# Patient Record
Sex: Female | Born: 1970 | Race: Black or African American | Hispanic: No | Marital: Married | Smoking: Never smoker
Health system: Southern US, Community
[De-identification: ages and names within clinical notes are randomized; demographics above are authoritative.]

## PROBLEM LIST (undated history)

## (undated) DIAGNOSIS — F329 Major depressive disorder, single episode, unspecified: Secondary | ICD-10-CM

## (undated) DIAGNOSIS — I10 Essential (primary) hypertension: Secondary | ICD-10-CM

## (undated) DIAGNOSIS — F32A Depression, unspecified: Secondary | ICD-10-CM

## (undated) DIAGNOSIS — G4700 Insomnia, unspecified: Secondary | ICD-10-CM

## (undated) HISTORY — PX: TUBAL LIGATION: SHX77

---

## 2000-04-28 ENCOUNTER — Other Ambulatory Visit: Admission: RE | Admit: 2000-04-28 | Discharge: 2000-04-28 | Payer: Self-pay | Admitting: Obstetrics and Gynecology

## 2009-05-08 ENCOUNTER — Emergency Department (HOSPITAL_BASED_OUTPATIENT_CLINIC_OR_DEPARTMENT_OTHER): Admission: EM | Admit: 2009-05-08 | Discharge: 2009-05-08 | Payer: Self-pay | Admitting: Emergency Medicine

## 2010-05-05 ENCOUNTER — Emergency Department (HOSPITAL_BASED_OUTPATIENT_CLINIC_OR_DEPARTMENT_OTHER): Admission: EM | Admit: 2010-05-05 | Discharge: 2010-05-05 | Payer: Self-pay | Admitting: Emergency Medicine

## 2010-05-05 ENCOUNTER — Ambulatory Visit: Payer: Self-pay | Admitting: Diagnostic Radiology

## 2010-11-06 LAB — COMPREHENSIVE METABOLIC PANEL
BUN: 10 mg/dL (ref 6–23)
CO2: 29 mEq/L (ref 19–32)
Calcium: 9.6 mg/dL (ref 8.4–10.5)
Creatinine, Ser: 0.7 mg/dL (ref 0.4–1.2)
GFR calc non Af Amer: >60 mL/min (ref >60)
Glucose, Bld: 106 mg/dL — ABNORMAL HIGH (ref 70–99)
Total Protein: 7.9 g/dL (ref 6.0–8.3)

## 2010-11-06 LAB — DIFFERENTIAL
Eosinophils Absolute: 0.1 10*3/uL (ref 0.0–0.7)
Lymphs Abs: 2.9 10*3/uL (ref 0.7–4.0)
Monocytes Relative: 7 % (ref 3–12)
Neutro Abs: 4.9 10*3/uL (ref 1.7–7.7)
Neutrophils Relative %: 57 % (ref 43–77)

## 2010-11-06 LAB — URINALYSIS, ROUTINE W REFLEX MICROSCOPIC
Glucose, UA: NEGATIVE mg/dL
Ketones, ur: NEGATIVE mg/dL
Leukocytes, UA: NEGATIVE
Nitrite: NEGATIVE
Protein, ur: NEGATIVE mg/dL

## 2010-11-06 LAB — CBC
HCT: 33.9 % — ABNORMAL LOW (ref 36.0–46.0)
Hemoglobin: 11.7 g/dL — ABNORMAL LOW (ref 12.0–15.0)
MCH: 30.3 pg (ref 26.0–34.0)
MCHC: 34.5 g/dL (ref 30.0–36.0)
MCV: 87.9 fL (ref 78.0–100.0)
RDW: 12.3 % (ref 11.5–15.5)

## 2010-11-06 LAB — LIPASE, BLOOD: Lipase: 57 U/L (ref 23–300)

## 2010-11-06 LAB — URINE MICROSCOPIC-ADD ON

## 2010-11-06 LAB — PREGNANCY, URINE: Preg Test, Ur: NEGATIVE

## 2011-04-28 ENCOUNTER — Encounter: Payer: Self-pay | Admitting: *Deleted

## 2011-04-28 ENCOUNTER — Emergency Department (INDEPENDENT_AMBULATORY_CARE_PROVIDER_SITE_OTHER): Payer: Self-pay

## 2011-04-28 ENCOUNTER — Emergency Department (HOSPITAL_BASED_OUTPATIENT_CLINIC_OR_DEPARTMENT_OTHER)
Admission: EM | Admit: 2011-04-28 | Discharge: 2011-04-28 | Disposition: A | Discharge status: Home / Self Care | Payer: Self-pay | Attending: Emergency Medicine | Admitting: Emergency Medicine

## 2011-04-28 DIAGNOSIS — R209 Unspecified disturbances of skin sensation: Secondary | ICD-10-CM | POA: Insufficient documentation

## 2011-04-28 DIAGNOSIS — M545 Low back pain, unspecified: Secondary | ICD-10-CM

## 2011-04-28 DIAGNOSIS — M549 Dorsalgia, unspecified: Secondary | ICD-10-CM | POA: Insufficient documentation

## 2011-04-28 DIAGNOSIS — M25559 Pain in unspecified hip: Secondary | ICD-10-CM | POA: Insufficient documentation

## 2011-04-28 DIAGNOSIS — N390 Urinary tract infection, site not specified: Secondary | ICD-10-CM | POA: Insufficient documentation

## 2011-04-28 LAB — URINALYSIS, ROUTINE W REFLEX MICROSCOPIC
Glucose, UA: NEGATIVE mg/dL
Ketones, ur: NEGATIVE mg/dL
Leukocytes, UA: NEGATIVE
Nitrite: NEGATIVE
Protein, ur: NEGATIVE mg/dL
pH: 5.5 (ref 5.0–8.0)

## 2011-04-28 LAB — URINE MICROSCOPIC-ADD ON

## 2011-04-28 MED ORDER — HYDROCODONE-ACETAMINOPHEN 5-325 MG PO TABS: 2.0000 | ORAL_TABLET | ORAL | Status: AC | PRN

## 2011-04-28 MED ORDER — NITROFURANTOIN MONOHYD MACRO 100 MG PO CAPS: 100.0000 mg | ORAL_CAPSULE | Freq: Two times a day (BID) | ORAL | Status: AC

## 2011-04-28 MED ORDER — IBUPROFEN 800 MG PO TABS: 800.0000 mg | ORAL_TABLET | Freq: Three times a day (TID) | ORAL | Status: AC

## 2011-04-28 MED ORDER — HYDROCODONE-ACETAMINOPHEN 5-325 MG PO TABS
2.0000 | ORAL_TABLET | Freq: Once | ORAL | Status: AC
  Administered 2011-04-28: 2 via ORAL
  Filled 2011-04-28: qty 2

## 2011-04-28 NOTE — ED Provider Notes (Signed)
History     CSN: [id number] Arrival date & time: 04/28/2011 11:39 AM  Chief Complaint  Patient presents with  . Back Pain   Patient is a 40 y.o. female presenting with back pain. The history is provided by the patient. No language interpreter was used.  Back Pain  The current episode started yesterday. The problem occurs constantly. The problem has been gradually improving. The pain is associated with no known injury. The pain is present in the lumbar spine. The quality of the pain is described as aching. The pain is at a severity of 8/10. The pain is moderate. The symptoms are aggravated by bending. The pain is worse during the day. Stiffness is present in the morning. Associated symptoms include numbness, leg pain, paresthesias, paresis and tingling. Pertinent negatives include no dysuria and no pelvic pain.    History reviewed. No pertinent past medical history.  Past Surgical History  Procedure Date  . Tubal ligation     History reviewed. No pertinent family history.  History  Substance Use Topics  . Smoking status: Never Smoker   . Smokeless tobacco: Not on file  . Alcohol Use: No    OB History    Grav Para Term Preterm Abortions TAB SAB Ect Mult Living                  Review of Systems  Genitourinary: Negative for dysuria and pelvic pain.  Musculoskeletal: Positive for back pain.  Neurological: Positive for tingling, numbness and paresthesias.  All other systems reviewed and are negative.    Physical Exam  BP 139/80  Pulse 71  Temp(Src) 98.2 F (36.8 C) (Oral)  Resp 18  Ht 5\' 6"  (1.676 m)  Wt 161 lb (73.029 kg)  BMI 25.99 kg/m2  SpO2 100%  LMP 04/07/2011  Physical Exam  Nursing note and vitals reviewed. Constitutional: She is oriented to person, place, and time. She appears well-developed and well-nourished.  HENT:  Head: Normocephalic and atraumatic.  Eyes: Conjunctivae and EOM are normal. Pupils are equal, round, and reactive to light.  Neck:  Normal range of motion. Neck supple.  Cardiovascular: Normal rate and regular rhythm.   Pulmonary/Chest: Effort normal and breath sounds normal.  Abdominal: Soft.  Musculoskeletal: She exhibits tenderness.       Tender low back,  Pain with movement,  Pain with walking,  Neurological: She is alert and oriented to person, place, and time.  Skin: Skin is warm.    ED Course  Procedures  MDM Pt reports no injury,  Pt had some pain down right leg,  Pt complains of pain in her right hip.  Xray show ostetitis pubis,  I will treat uti, and pain.  Pt given Rx for macrobid and hydrocodone and ibuprofen.   Pt referred to Dr. Pearletha Forge for recheck. (Pt alos given [facility] info.      Langston Masker, [place] 04/28/11 1346

## 2011-04-28 NOTE — ED Notes (Signed)
Low back pain onset yesterday hurts worse when bending over or sitting a long time  States right leg feels tingly gets better after walking but starts after sitting

## 2011-04-28 NOTE — ED Provider Notes (Signed)
Medical screening examination/treatment/procedure(s) were performed by non-physician practitioner and as supervising physician I was immediately available for consultation/collaboration.   Glynn Octave, MD 04/28/11 [phone]

## 2011-06-17 ENCOUNTER — Other Ambulatory Visit (HOSPITAL_COMMUNITY): Payer: Self-pay | Admitting: Internal Medicine

## 2011-06-17 DIAGNOSIS — Z1231 Encounter for screening mammogram for malignant neoplasm of breast: Secondary | ICD-10-CM

## 2011-07-17 ENCOUNTER — Ambulatory Visit (HOSPITAL_COMMUNITY)
Admission: RE | Admit: 2011-07-17 | Discharge: 2011-07-17 | Disposition: A | Discharge status: Home / Self Care | Payer: Self-pay | Source: Ambulatory Visit | Attending: Internal Medicine | Admitting: Internal Medicine

## 2011-07-17 DIAGNOSIS — Z1231 Encounter for screening mammogram for malignant neoplasm of breast: Secondary | ICD-10-CM

## 2013-10-03 ENCOUNTER — Other Ambulatory Visit: Payer: Self-pay | Admitting: Obstetrics and Gynecology

## 2013-10-03 ENCOUNTER — Other Ambulatory Visit (HOSPITAL_COMMUNITY)
Admission: RE | Admit: 2013-10-03 | Discharge: 2013-10-03 | Disposition: A | Discharge status: Home / Self Care | Payer: BC Managed Care – PPO | Source: Ambulatory Visit | Attending: Obstetrics and Gynecology | Admitting: Obstetrics and Gynecology

## 2013-10-03 DIAGNOSIS — Z1151 Encounter for screening for human papillomavirus (HPV): Secondary | ICD-10-CM | POA: Insufficient documentation

## 2013-10-03 DIAGNOSIS — Z01419 Encounter for gynecological examination (general) (routine) without abnormal findings: Secondary | ICD-10-CM | POA: Insufficient documentation

## 2016-01-15 ENCOUNTER — Encounter (HOSPITAL_BASED_OUTPATIENT_CLINIC_OR_DEPARTMENT_OTHER): Payer: Self-pay | Admitting: Emergency Medicine

## 2016-01-15 ENCOUNTER — Emergency Department (HOSPITAL_BASED_OUTPATIENT_CLINIC_OR_DEPARTMENT_OTHER)
Admission: EM | Admit: 2016-01-15 | Discharge: 2016-01-15 | Disposition: A | Discharge status: Home / Self Care | Payer: Self-pay | Attending: Emergency Medicine | Admitting: Emergency Medicine

## 2016-01-15 DIAGNOSIS — Z79899 Other long term (current) drug therapy: Secondary | ICD-10-CM | POA: Insufficient documentation

## 2016-01-15 DIAGNOSIS — I10 Essential (primary) hypertension: Secondary | ICD-10-CM | POA: Insufficient documentation

## 2016-01-15 DIAGNOSIS — F329 Major depressive disorder, single episode, unspecified: Secondary | ICD-10-CM | POA: Insufficient documentation

## 2016-01-15 DIAGNOSIS — R51 Headache: Secondary | ICD-10-CM | POA: Insufficient documentation

## 2016-01-15 DIAGNOSIS — R519 Headache, unspecified: Secondary | ICD-10-CM

## 2016-01-15 HISTORY — DX: Depression, unspecified: F32.A

## 2016-01-15 HISTORY — DX: Essential (primary) hypertension: I10

## 2016-01-15 HISTORY — DX: Major depressive disorder, single episode, unspecified: F32.9

## 2016-01-15 HISTORY — DX: Insomnia, unspecified: G47.00

## 2016-01-15 MED ORDER — DIPHENHYDRAMINE HCL 50 MG/ML IJ SOLN
25.0000 mg | Freq: Once | INTRAMUSCULAR | Status: AC
  Administered 2016-01-15: 25 mg via INTRAVENOUS
  Filled 2016-01-15: qty 1

## 2016-01-15 MED ORDER — KETOROLAC TROMETHAMINE 15 MG/ML IJ SOLN
15.0000 mg | Freq: Once | INTRAMUSCULAR | Status: AC
  Administered 2016-01-15: 15 mg via INTRAVENOUS
  Filled 2016-01-15: qty 1

## 2016-01-15 MED ORDER — METOCLOPRAMIDE HCL 5 MG/ML IJ SOLN
10.0000 mg | Freq: Once | INTRAMUSCULAR | Status: AC
  Administered 2016-01-15: 10 mg via INTRAVENOUS
  Filled 2016-01-15: qty 2

## 2016-01-15 NOTE — Discharge Instructions (Signed)

## 2016-01-15 NOTE — ED Notes (Signed)
PA at bedside.

## 2016-01-15 NOTE — ED Notes (Signed)
Headache started at 5:30 this morning and has gotten worse throughout day.  Took Excederin Migraine at 1pm and it helped a little.  Also some facial numbness around eyes that started 2 days ago.

## 2016-01-15 NOTE — ED Provider Notes (Signed)
CSN: [id number]     Arrival date & time 01/15/16  1730 History   First MD Initiated Contact with Patient 01/15/16 1743     Chief Complaint  Patient presents with  . Headache    HPI   45 year old female presents today with complaints of headache. Patient reports that she woke up this morning and was having headache that she described as squeezing from the back of her head to the front. Patient notes this is typical of her previous headaches, and was able to go to work. She reports that at work symptoms continue persist, she took a Excedrin Migraine which helped somewhat but did not completely dissipate the headache. Patient notes that she was feeling unwell throughout the day today, but reports that she is feeling better at presentation. Patient denies any fever, chills, nausea, vomiting, neck stiffness, light sensitivity, decreased strength. Patient notes that over the last 2 days she's had intermittent decreased sensation to the skin in between her eyes, but denies any other other neurological deficit including deficits in strength, sensation, motor function. Patient notes that she does not use drugs or alcohol, denies trauma to the head, denies any changes in small taste or vision, reports that she started taking Wellbutrin for depression last week and has had decreased appetite since.  Past Medical History  Diagnosis Date  . Hypertension   . Depression   . Sleeplessness    Past Surgical History  Procedure Laterality Date  . Tubal ligation     No family history on file. Social History  Substance Use Topics  . Smoking status: Never Smoker   . Smokeless tobacco: None  . Alcohol Use: No   OB History    No data available     Review of Systems  All other systems reviewed and are negative.  Allergies  Review of patient's allergies indicates no known allergies.  Home Medications   Prior to Admission medications   Medication Sig Start Date End Date Taking? Authorizing Provider   amLODipine (NORVASC) 10 MG tablet Take 10 mg by mouth daily.   Yes Historical Provider, MD  buPROPion (WELLBUTRIN XL) 150 MG 24 hr tablet Take 150 mg by mouth daily.   Yes Historical Provider, MD  FLUoxetine (PROZAC) 10 MG tablet Take 20 mg by mouth daily.   Yes Historical Provider, MD  zolpidem (AMBIEN CR) 6.25 MG CR tablet Take 6.25 mg by mouth at bedtime as needed for sleep.   Yes Historical Provider, MD   BP 142/92 mmHg  Pulse 67  Temp(Src) 98.6 F (37 C) (Oral)  Resp 18  Ht 5\' 5"  (1.651 m)  Wt 69.854 kg  BMI 25.63 kg/m2  SpO2 100%  LMP 01/11/2016 (Exact Date)   Physical Exam  Constitutional: She is oriented to person, place, and time. She appears well-developed and well-nourished. No distress.  HENT:  Head: Normocephalic.  Eyes: Conjunctivae and EOM are normal. Pupils are equal, round, and reactive to light. Right eye exhibits no discharge. Left eye exhibits no discharge. No scleral icterus.  Neck: Normal range of motion. Neck supple. No JVD present.  Pulmonary/Chest: No stridor.  Musculoskeletal: Normal range of motion. She exhibits no edema or tenderness.  Lymphadenopathy:    She has no cervical adenopathy.  Neurological: She is alert and oriented to person, place, and time. She has normal strength. She displays no atrophy and no tremor. No cranial nerve deficit or sensory deficit. She exhibits normal muscle tone. She displays a negative Romberg sign. She displays no  seizure activity. Coordination and gait normal. GCS eye subscore is 4. GCS verbal subscore is 5. GCS motor subscore is 6.  Reflex Scores:      Patellar reflexes are 2+ on the right side and 2+ on the left side. Skin: Skin is warm and dry. No rash noted. She is not diaphoretic. No erythema. No pallor.  Psychiatric:  Flat affect  Nursing note and vitals reviewed.   ED Course  Procedures (including critical care time) Labs Review Labs Reviewed - No data to display  Imaging Review No results found. I have  personally reviewed and evaluated these images and lab results as part of my medical decision-making.   EKG Interpretation   Date/Time:  Wednesday Jan 15 2016 17:39:57 EDT Ventricular Rate:  60 PR Interval:  135 QRS Duration: 82 QT Interval:  420 QTC Calculation: 420 R Axis:   79 Text Interpretation:  Sinus rhythm Abnormal R-wave progression, early  transition No old tracing to compare Confirmed by [facility]  MD, MARTHA  [phone]) on 01/15/2016 5:58:12 PM      MDM   Final diagnoses:  Nonintractable headache, unspecified chronicity pattern, unspecified headache type    Labs:   Imaging:  Consults:  Therapeutics:  Discharge Meds:   Assessment/Plan: 45 year old female presents with a headache today. Patient has no red flags that would indicate further evaluation and management here in the ED. She reports good symptomatic improvement with above medications. Patient does report numbness and a patch of her skin in between her eyes, this is resolved upon reevaluation, and has been going on for several days. I have very low suspicion for any acute intracranial abnormality in this patient. She was instructed to follow-up with her primary care provider for reevaluation further management. Patient verbalized understanding and agreement today's plan had no further questions or concerns at time of discharge        Eyvonne Mechanic, PA-C 01/16/16 0021  Jerelyn Scott, MD 01/16/16 [phone]

## 2017-09-02 ENCOUNTER — Ambulatory Visit (INDEPENDENT_AMBULATORY_CARE_PROVIDER_SITE_OTHER): Payer: Self-pay | Admitting: Orthopaedic Surgery

## 2020-06-15 ENCOUNTER — Emergency Department (HOSPITAL_BASED_OUTPATIENT_CLINIC_OR_DEPARTMENT_OTHER)
Admission: EM | Admit: 2020-06-15 | Discharge: 2020-06-15 | Disposition: A | Discharge status: Home / Self Care | Payer: No Typology Code available for payment source | Attending: Emergency Medicine | Admitting: Emergency Medicine

## 2020-06-15 ENCOUNTER — Emergency Department (HOSPITAL_BASED_OUTPATIENT_CLINIC_OR_DEPARTMENT_OTHER): Payer: No Typology Code available for payment source

## 2020-06-15 ENCOUNTER — Other Ambulatory Visit: Payer: Self-pay

## 2020-06-15 ENCOUNTER — Encounter (HOSPITAL_BASED_OUTPATIENT_CLINIC_OR_DEPARTMENT_OTHER): Payer: Self-pay | Admitting: *Deleted

## 2020-06-15 DIAGNOSIS — M25562 Pain in left knee: Secondary | ICD-10-CM | POA: Insufficient documentation

## 2020-06-15 DIAGNOSIS — Y990 Civilian activity done for income or pay: Secondary | ICD-10-CM | POA: Insufficient documentation

## 2020-06-15 DIAGNOSIS — W19XXXA Unspecified fall, initial encounter: Secondary | ICD-10-CM | POA: Insufficient documentation

## 2020-06-15 DIAGNOSIS — I10 Essential (primary) hypertension: Secondary | ICD-10-CM | POA: Insufficient documentation

## 2020-06-15 DIAGNOSIS — Z79899 Other long term (current) drug therapy: Secondary | ICD-10-CM | POA: Diagnosis not present

## 2020-06-15 NOTE — ED Triage Notes (Signed)
Pt reports she fell yesterday and work and landed on her left knee. C/o pain and swelling in knee. Reports she is able to ambulate but her foot and leg feel cold since the fall

## 2020-06-15 NOTE — ED Provider Notes (Signed)
[facility] EMERGENCY DEPARTMENT Provider Note   CSN: [id number] Arrival date & time: 06/15/20  1459     History Chief Complaint  Patient presents with  . Fall    Lauren Rhodes is a 49 y.o. female with a past medical history of hypertension, sleeplessness, who presents today for evaluation of pain in her left knee.  She reports that yesterday she was at work when she had a mechanical nonsyncopal fall without any prodromal symptoms.  She states that she landed on her left knee.  Since then she has had throbbing pain in her left knee.  She tried heat however has not tried ibuprofen, Tylenol or any other interventions.  The heat did not significantly relieve her pain.  She has been ambulatory since this happened.  She denies striking her head or concern for any other injuries.  She does not take any blood thinning medications.  She, according to chart review refused a wheelchair and was ambulatory unassisted while in the department.    She states that she feels like the left knee and leg is cold compared to the other.  She denies any new paresthesias or numbness.  She states "that may be just because I have not warmed it up yet."  HPI     Past Medical History:  Diagnosis Date  . Depression   . Hypertension   . Sleeplessness     There are no problems to display for this patient.   Past Surgical History:  Procedure Laterality Date  . CESAREAN SECTION    . TUBAL LIGATION       OB History   No obstetric history on file.     No family history on file.  Social History   Tobacco Use  . Smoking status: Never Smoker  . Smokeless tobacco: Never Used  Substance Use Topics  . Alcohol use: No  . Drug use: No    Home Medications Prior to Admission medications   Medication Sig Start Date End Date Taking? Authorizing Provider  amLODipine (NORVASC) 10 MG tablet Take 10 mg by mouth daily.    [provider]  buPROPion (WELLBUTRIN XL) 150 MG 24 hr tablet Take 150  mg by mouth daily.    [provider]  FLUoxetine (PROZAC) 10 MG tablet Take 20 mg by mouth daily.    [provider]  zolpidem (AMBIEN CR) 6.25 MG CR tablet Take 6.25 mg by mouth at bedtime as needed for sleep.    [provider]    Allergies    Valsartan and Lisinopril  Review of Systems   Review of Systems  Constitutional: Negative for chills and fever.  Respiratory: Negative for chest tightness and shortness of breath.   Musculoskeletal: Positive for joint swelling. Negative for back pain and neck pain.  Skin:       Ecchymosis left anterior medial knee  Neurological: Negative for weakness and headaches.  All other systems reviewed and are negative.   Physical Exam Updated Vital Signs BP (!) 134/93 (BP Location: Right Arm)   Pulse 74   Temp 98.1 F (36.7 C) (Oral)   Resp 16   Ht 5\' 5"  (1.651 m)   Wt 70.8 kg   LMP 06/14/2020   SpO2 100%   BMI 25.96 kg/m   Physical Exam Vitals and nursing note reviewed.  Constitutional:      General: She is not in acute distress.    Appearance: She is not ill-appearing.  HENT:  Head: Normocephalic.  Cardiovascular:     Rate and Rhythm: Normal rate.     Comments: Plus left DP/PT pulses.  Tactile temperature is symmetric between bilateral lower extremities. Pulmonary:     Effort: Pulmonary effort is normal. No respiratory distress.  Musculoskeletal:     Comments: Moderate edema around the left knee with ecchymosis on the medial aspect.  She is able to actively bend her knee past 45 degrees and fully straighten it.  No obvious crepitus or deformity.  Knee is grossly stable to anterior/posterior drawer test and valgus/varus stress.  Skin:    Comments: Ecchymosis without laceration contusion abrasion or other skin breaks on the left knee.  Neurological:     Mental Status: She is alert.     Comments: Sensation intact to light touch to bilateral lower extremities and symmetric.     ED Results / Procedures  / Treatments   Labs (all labs ordered are listed, but only abnormal results are displayed) Labs Reviewed - No data to display  EKG None  Radiology DG Knee Complete 4 Views Left  Result Date: 06/15/2020 CLINICAL DATA:  Left knee pain after fall yesterday. EXAM: LEFT KNEE - COMPLETE 4+ VIEW COMPARISON:  None. FINDINGS: No evidence of fracture, dislocation, or joint effusion. No evidence of arthropathy or other focal bone abnormality. Soft tissues are unremarkable. IMPRESSION: Negative. Electronically Signed   By: Lupita Raider M.D.   On: 06/15/2020 15:53    Procedures Procedures (including critical care time)  Medications Ordered in ED Medications - No data to display  ED Course  I have reviewed the triage vital signs and the nursing notes.  Pertinent labs & imaging results that were available during my care of the patient were reviewed by me and considered in my medical decision making (see chart for details).    MDM Rules/Calculators/A&P                         Patient is a 49 year old woman who presents today for evaluation of left knee pain after a fall that happened yesterday at work.  On exam she has moderate edema of the left knee.  Her primary complaint is aside from pain, but the knee feels and leg feels cold.  She has intact distal pulses that are symmetric bilaterally and tactilely legs are same temperature.  Extremes are obtained without fracture or other significant abnormality.  Suspect contusion versus knee strain/internal derangements.  She is given a hinged knee sleeve.  Crutches or walker are offered both of which she refused.  She denies any other injuries from this, did not strike her head or pass out, and does not take any blood thinning medications.  She is given outpatient sports medicine/orthopedics follow-up.    RICE, brace and conservative care indicated.   Final Clinical Impression(s) / ED Diagnoses Final diagnoses:  Acute pain of left knee  Fall,  initial encounter    Rx / DC Orders ED Discharge Orders    None       Norman Clay 06/15/20 2349    Pollyann Savoy, MD 06/16/20 1259

## 2020-06-15 NOTE — ED Notes (Signed)
Pt refused wheelchair, ambulatory unassisted in waiting room.

## 2020-06-15 NOTE — Discharge Instructions (Addendum)
While in the emergency room your blood pressure was minimally elevated.  Please get this rechecked in the next month.  Please take Ibuprofen (Advil, motrin) and Tylenol (acetaminophen) to relieve your pain.  You may take up to 600 MG (3 pills) of normal strength ibuprofen every 8 hours as needed.  In between doses of ibuprofen you make take tylenol, up to 1,000 mg (two extra strength pills).  Do not take more than 3,000 mg tylenol in a 24 hour period.  Please check all medication labels as many medications such as pain and cold medications may contain tylenol.  Do not drink alcohol while taking these medications.  Do not take other NSAID'S while taking ibuprofen (such as aleve or naproxen).  Please take ibuprofen with food to decrease stomach upset.

## 2020-07-09 ENCOUNTER — Telehealth: Payer: Self-pay | Admitting: Family Medicine

## 2020-07-09 NOTE — Telephone Encounter (Signed)
Called pt to offer ED f/u for knee injury--Patient decline already has appt w/ [facility]  Provider.  -glh

## 2022-02-28 IMAGING — DX DG KNEE COMPLETE 4+V*L*
4 series · 4 of 4 positions shown · non-contrast
Comparison: None.

CLINICAL DATA: Left knee pain after fall yesterday.

EXAM:
LEFT KNEE - COMPLETE 4+ VIEW

[knee ap]
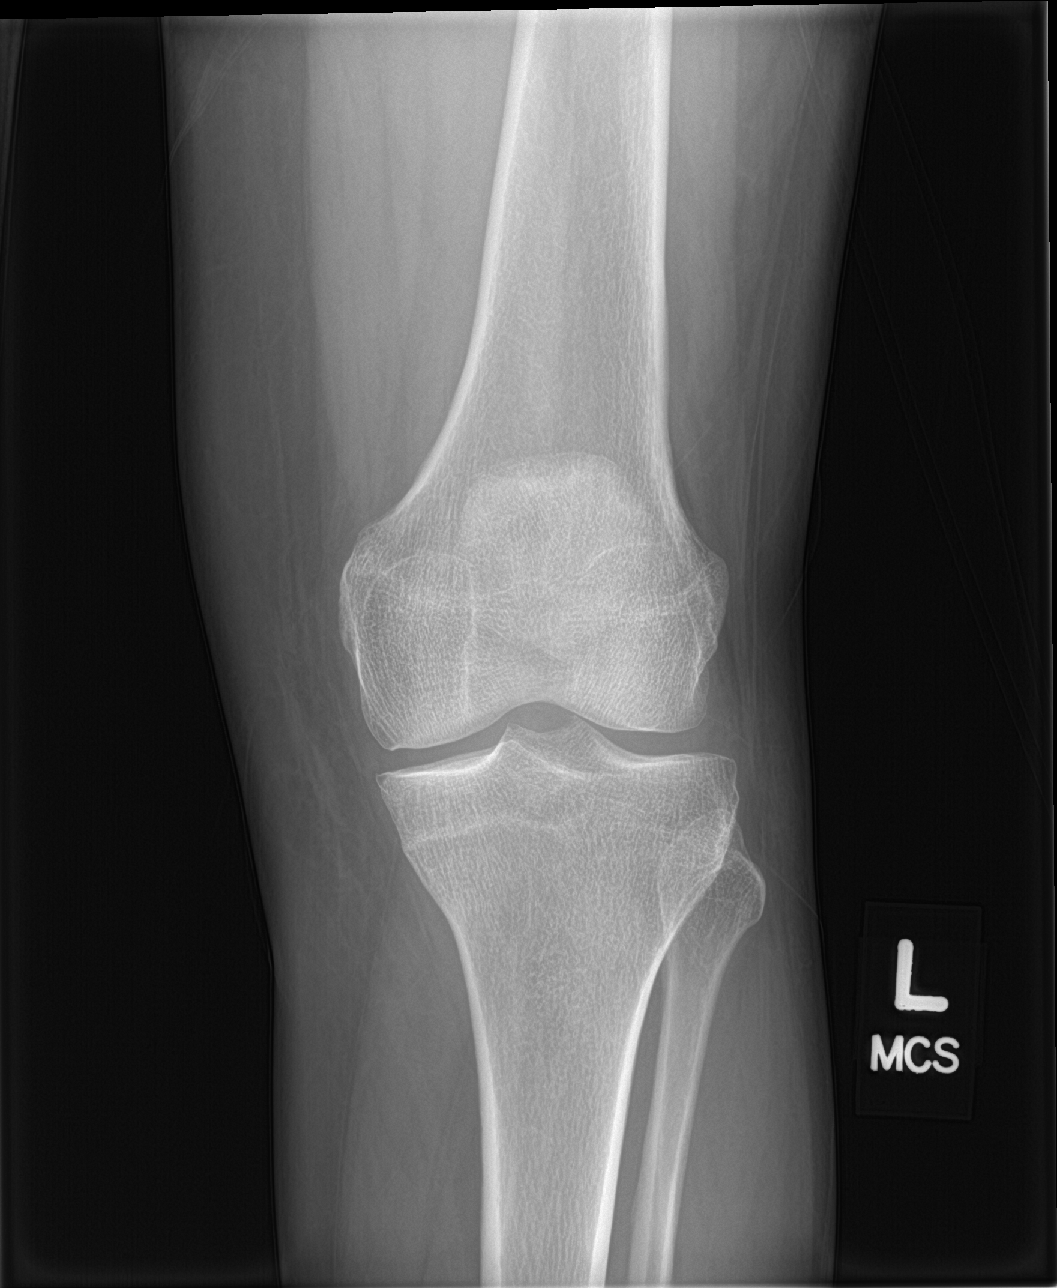

[knee lat]
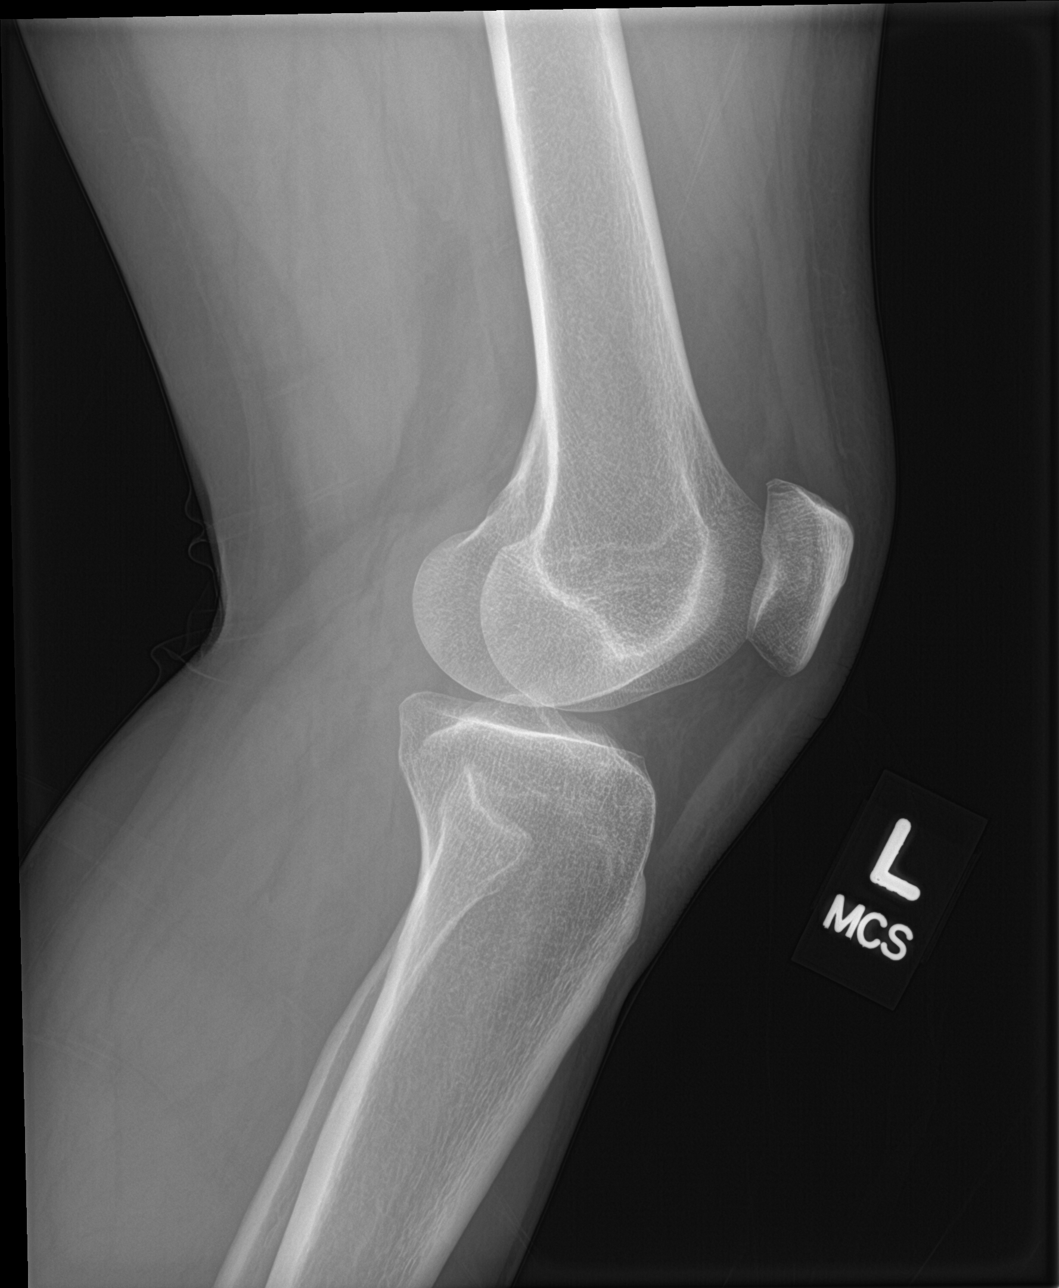

[knee obl (1 of 2)]
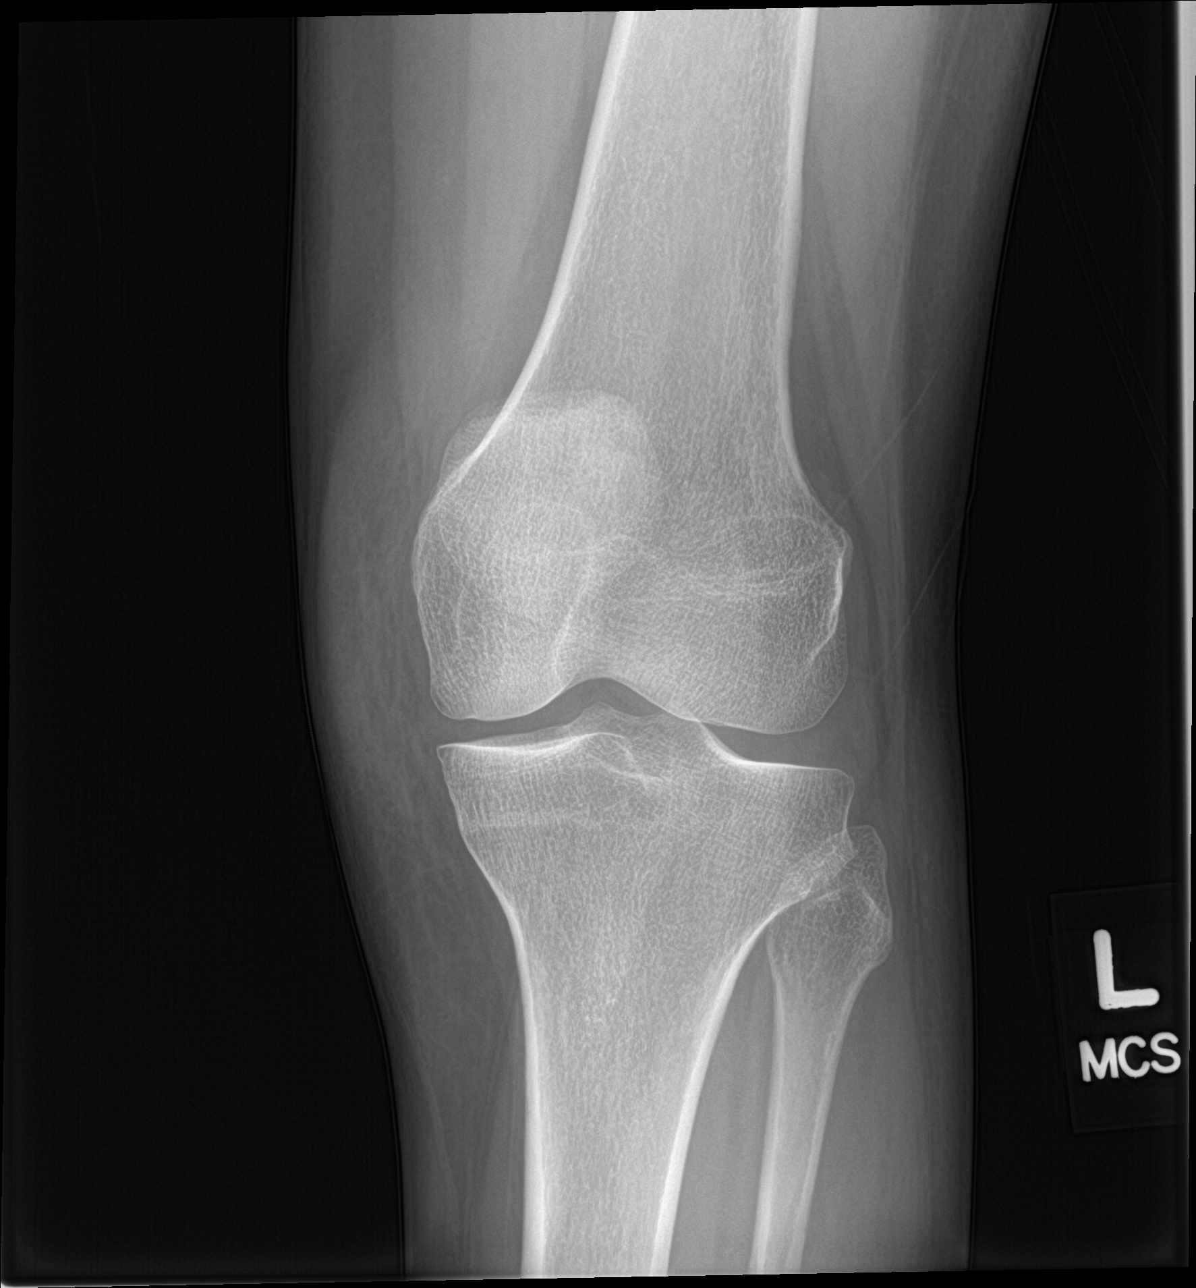

[knee obl (2 of 2)]
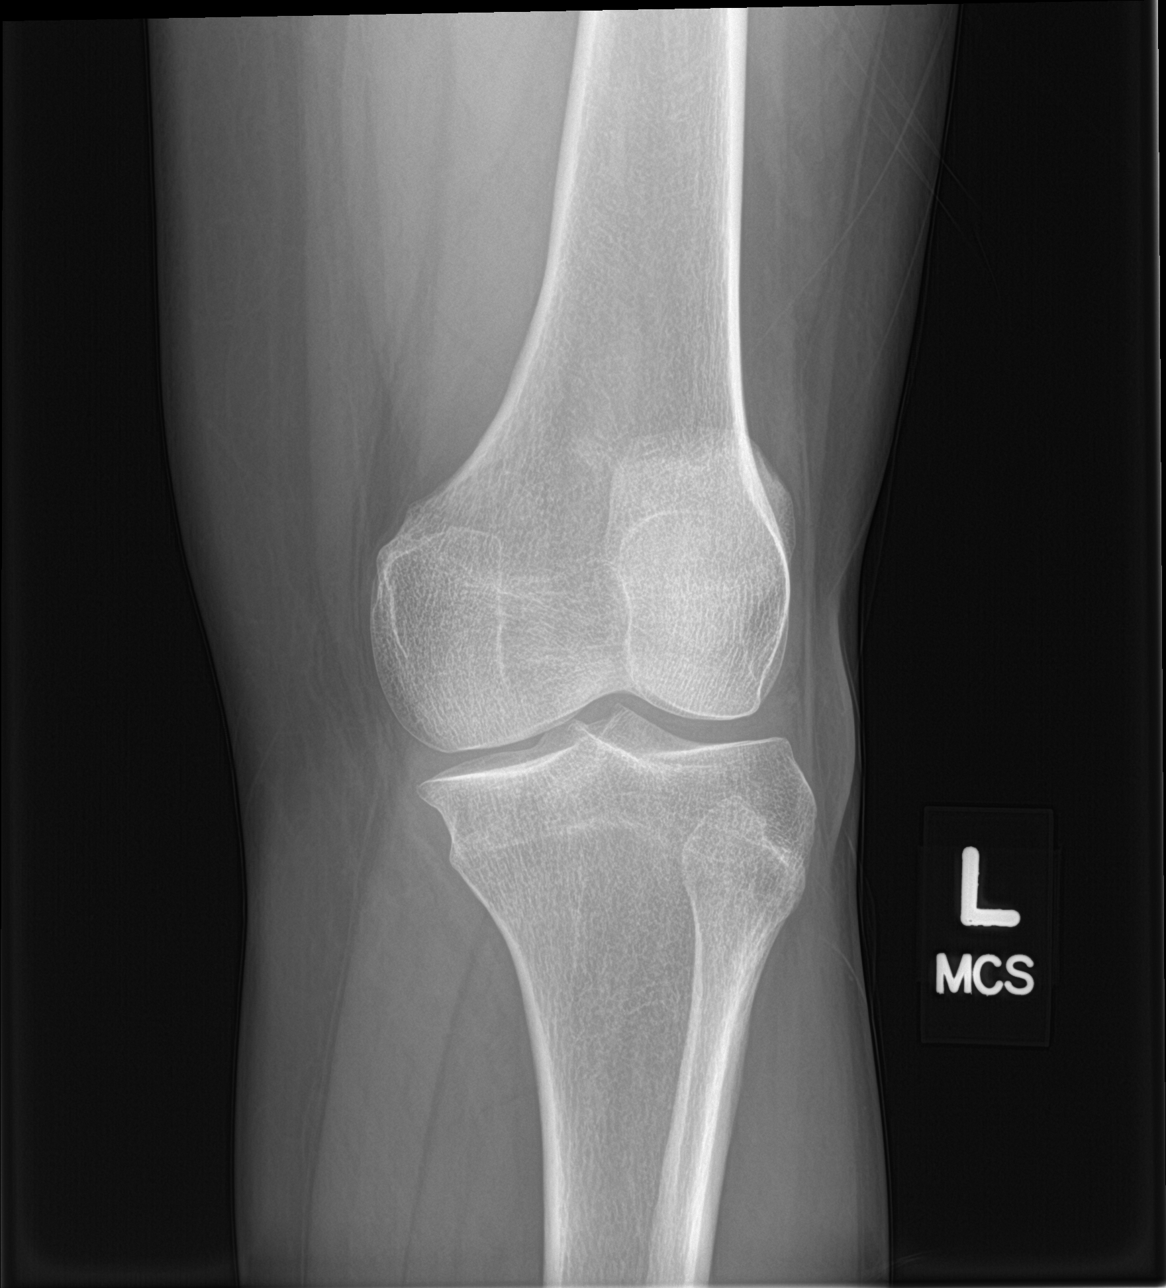

[4 of 4 positions shown; findings below may reference images not displayed]

FINDINGS: No evidence of fracture, dislocation, or joint effusion. No evidence
of arthropathy or other focal bone abnormality. Soft tissues are
unremarkable.
IMPRESSION: Negative.

## 2022-12-10 ENCOUNTER — Encounter: Payer: Self-pay | Admitting: *Deleted
